# Patient Record
Sex: Male | Born: 1990 | Race: Black or African American | Hispanic: No | Marital: Single | Smoking: Current some day smoker
Health system: Southern US, Community
[De-identification: ages and names within clinical notes are randomized; demographics above are authoritative.]

---
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
## 2014-07-22 ENCOUNTER — Emergency Department: Payer: Self-pay | Admitting: Emergency Medicine

## 2015-12-23 ENCOUNTER — Emergency Department
Admission: EM | Admit: 2015-12-23 | Discharge: 2015-12-23 | Disposition: A | Discharge status: Home / Self Care | Payer: Self-pay | Attending: Emergency Medicine | Admitting: Emergency Medicine

## 2015-12-23 ENCOUNTER — Encounter: Payer: Self-pay | Admitting: Emergency Medicine

## 2015-12-23 ENCOUNTER — Emergency Department: Payer: Self-pay

## 2015-12-23 DIAGNOSIS — F17210 Nicotine dependence, cigarettes, uncomplicated: Secondary | ICD-10-CM | POA: Insufficient documentation

## 2015-12-23 DIAGNOSIS — M542 Cervicalgia: Secondary | ICD-10-CM | POA: Insufficient documentation

## 2015-12-23 DIAGNOSIS — Y929 Unspecified place or not applicable: Secondary | ICD-10-CM | POA: Insufficient documentation

## 2015-12-23 DIAGNOSIS — Y999 Unspecified external cause status: Secondary | ICD-10-CM | POA: Insufficient documentation

## 2015-12-23 DIAGNOSIS — W108XXA Fall (on) (from) other stairs and steps, initial encounter: Secondary | ICD-10-CM | POA: Insufficient documentation

## 2015-12-23 DIAGNOSIS — M549 Dorsalgia, unspecified: Secondary | ICD-10-CM | POA: Insufficient documentation

## 2015-12-23 DIAGNOSIS — Y939 Activity, unspecified: Secondary | ICD-10-CM | POA: Insufficient documentation

## 2015-12-23 DIAGNOSIS — S300XXA Contusion of lower back and pelvis, initial encounter: Secondary | ICD-10-CM | POA: Insufficient documentation

## 2015-12-23 MED ORDER — OXYCODONE-ACETAMINOPHEN 7.5-325 MG PO TABS
1.0000 | ORAL_TABLET | Freq: Four times a day (QID) | ORAL | 0 refills | Status: DC | PRN
Start: 2015-12-23 — End: 2023-02-14

## 2015-12-23 MED ORDER — OXYCODONE-ACETAMINOPHEN 5-325 MG PO TABS
1.0000 | ORAL_TABLET | Freq: Once | ORAL | Status: AC
  Administered 2015-12-23: 1 via ORAL
  Filled 2015-12-23: qty 1

## 2015-12-23 MED ORDER — NAPROXEN 500 MG PO TABS: 500.0000 mg | ORAL_TABLET | Freq: Two times a day (BID) | ORAL | 0 refills | Status: DC

## 2015-12-23 MED ORDER — NAPROXEN 500 MG PO TABS
500.0000 mg | ORAL_TABLET | Freq: Once | ORAL | Status: AC
  Administered 2015-12-23: 500 mg via ORAL
  Filled 2015-12-23: qty 1

## 2015-12-23 NOTE — ED Notes (Signed)
Patient transported to X-ray 

## 2015-12-23 NOTE — ED Notes (Signed)
Pt reports slipped down the steps this am hurting his back. Pt reports was holding younger brother at the time and took the brunt of the impact to keep him safe. Pt c/o pain to very lower back. Pt also c/o neck stiffness from the fall. Was told by first responders that he may have whiplash. Pt denies LOC.  No obvious bruising noted.

## 2015-12-23 NOTE — ED Triage Notes (Signed)
Pt states he fell down the stairs this morning while carrying his younger brother. States that his lower back, tailbone, and neck hurt. Pt also reports that the step broke when he fell. Pt is moving around and groaning during triage.

## 2015-12-23 NOTE — ED Provider Notes (Signed)
[facility] Emergency Department Provider Note   ____________________________________________   None    (approximate)  I have reviewed the triage vital signs and the nursing notes.   HISTORY  Chief Complaint Fall and Back Pain    HPI Glenn Robbins is a 25 y.o. male patient complaining of coccyx pain secondary to a fall. Patient say fell while going down steps care and a younger brother. Patient also complaining of low back and neck pain. He denies any radicular component to her neck or back pain. He denies any bladder or bowel dysfunction. Instead occurred this morning. Patient states pain increase with sitting.Patient rates his pain as a 9/10. Patient described a pain as sharp. No palliative measures for this complaint.   History reviewed. No pertinent past medical history.  There are no active problems to display for this patient.   History reviewed. No pertinent surgical history.  Prior to Admission medications   Medication Sig Start Date End Date Taking? Authorizing Provider  naproxen (NAPROSYN) 500 MG tablet Take 1 tablet (500 mg total) by mouth 2 (two) times daily with a meal. 12/23/15   Joni Reining, PA-C  oxyCODONE-acetaminophen (PERCOCET) 7.5-325 MG tablet Take 1 tablet by mouth every 6 (six) hours as needed for severe pain. 12/23/15   Joni Reining, PA-C    Allergies Review of patient's allergies indicates not on file.  History reviewed. No pertinent family history.  Social History Social History  Substance Use Topics  . Smoking status: Current Every Day Smoker    Packs/day: 0.50    Types: Cigarettes  . Smokeless tobacco: Never Used  . Alcohol use No    Review of Systems Constitutional: No fever/chills Eyes: No visual changes. ENT: No sore throat. Cardiovascular: Denies chest pain. Respiratory: Denies shortness of breath. Gastrointestinal: No abdominal pain.  No nausea, no vomiting.  No diarrhea.  No  constipation. Genitourinary: Negative for dysuria. Musculoskeletal: Positive for back pain. Skin: Negative for rash. Neurological: Negative for headaches, focal weakness or numbness.    ____________________________________________   PHYSICAL EXAM:  VITAL SIGNS: ED Triage Vitals [12/23/15 0944]  Enc Vitals Group     BP 136/82     Pulse Rate 74     Resp 20     Temp 98.2 F (36.8 C)     Temp src      SpO2 98 %     Weight (!) 340 lb (154.2 kg)     Height 5\' 11"  (1.803 m)     Head Circumference      Peak Flow      Pain Score 9     Pain Loc      Pain Edu?      Excl. in GC?     Constitutional: Alert and oriented. Well appearing and in no acute distress.Obesity Eyes: Conjunctivae are normal. PERRL. EOMI. Head: Atraumatic. Nose: No congestion/rhinnorhea. Mouth/Throat: Mucous membranes are moist.  Oropharynx non-erythematous. Neck: No stridor.  No cervical spine tenderness to palpation. Hematological/Lymphatic/Immunilogical: No cervical lymphadenopathy. Cardiovascular: Normal rate, regular rhythm. Grossly normal heart sounds.  Good peripheral circulation. Respiratory: Normal respiratory effort.  No retractions. Lungs CTAB. Gastrointestinal: Soft and nontender. No distention. No abdominal bruits. No CVA tenderness. Musculoskeletal: No lower extremity tenderness nor edema.  No joint effusions. Moderate guarding palpation L4-S1. Neurologic:  Normal speech and language. No gross focal neurologic deficits are appreciated. No gait instability. Skin:  Skin is warm, dry and intact. No rash noted. No abrasion or ecchymosis. Psychiatric:  Mood and affect are normal. Speech and behavior are normal.  ____________________________________________   LABS (all labs ordered are listed, but only abnormal results are displayed)  Labs Reviewed - No data to display ____________________________________________  EKG   ____________________________________________  RADIOLOGY  No acute  findings x-ray of the coccyx area. ____________________________________________   PROCEDURES  Procedure(s) performed: None  Procedures  Critical Care performed: No  ____________________________________________   INITIAL IMPRESSION / ASSESSMENT AND PLAN / ED COURSE  Pertinent labs & imaging results that were available during my care of the patient were reviewed by me and considered in my medical decision making (see chart for details).  Coccyx contusion. Discussed negative x-ray findings with patient. Patient given discharge Instructions. Patient given prescription for naproxen and Percocets. Patient [facility] condition [facility].  Clinical Course     ____________________________________________   FINAL CLINICAL IMPRESSION(S) / ED DIAGNOSES  Final diagnoses:  Coccyx contusion, initial encounter      NEW MEDICATIONS STARTED DURING THIS VISIT:  New Prescriptions   NAPROXEN (NAPROSYN) 500 MG TABLET    Take 1 tablet (500 mg total) by mouth 2 (two) times daily with a meal.   OXYCODONE-ACETAMINOPHEN (PERCOCET) 7.5-325 MG TABLET    Take 1 tablet by mouth every 6 (six) hours as needed for severe pain.     Note:  This document was prepared using Dragon voice recognition software and may include unintentional dictation errors.    Joni Reiningonald K Smith, PA-C 12/23/15 1139    Jeanmarie PlantJames A McShane, MD 12/23/15 1501

## 2016-05-01 ENCOUNTER — Encounter: Payer: Self-pay | Admitting: Emergency Medicine

## 2016-05-01 ENCOUNTER — Emergency Department
Admission: EM | Admit: 2016-05-01 | Discharge: 2016-05-01 | Disposition: A | Discharge status: Home / Self Care | Payer: Self-pay | Attending: Emergency Medicine | Admitting: Emergency Medicine

## 2016-05-01 DIAGNOSIS — Y999 Unspecified external cause status: Secondary | ICD-10-CM | POA: Insufficient documentation

## 2016-05-01 DIAGNOSIS — Y9389 Activity, other specified: Secondary | ICD-10-CM | POA: Insufficient documentation

## 2016-05-01 DIAGNOSIS — S61216D Laceration without foreign body of right little finger without damage to nail, subsequent encounter: Secondary | ICD-10-CM

## 2016-05-01 DIAGNOSIS — Y929 Unspecified place or not applicable: Secondary | ICD-10-CM | POA: Insufficient documentation

## 2016-05-01 DIAGNOSIS — S61216A Laceration without foreign body of right little finger without damage to nail, initial encounter: Secondary | ICD-10-CM | POA: Insufficient documentation

## 2016-05-01 DIAGNOSIS — W25XXXA Contact with sharp glass, initial encounter: Secondary | ICD-10-CM | POA: Insufficient documentation

## 2016-05-01 DIAGNOSIS — F17210 Nicotine dependence, cigarettes, uncomplicated: Secondary | ICD-10-CM | POA: Insufficient documentation

## 2016-05-01 MED ORDER — TRAMADOL HCL 50 MG PO TABS
50.0000 mg | ORAL_TABLET | Freq: Once | ORAL | Status: AC
  Administered 2016-05-01: 50 mg via ORAL
  Filled 2016-05-01: qty 1

## 2016-05-01 MED ORDER — SULFAMETHOXAZOLE-TRIMETHOPRIM 800-160 MG PO TABS
1.0000 | ORAL_TABLET | Freq: Two times a day (BID) | ORAL | 0 refills | Status: DC
Start: 2016-05-01 — End: 2023-02-14

## 2016-05-01 MED ORDER — SULFAMETHOXAZOLE-TRIMETHOPRIM 800-160 MG PO TABS
1.0000 | ORAL_TABLET | Freq: Once | ORAL | Status: AC
  Administered 2016-05-01: 1 via ORAL
  Filled 2016-05-01: qty 1

## 2016-05-01 MED ORDER — TRAMADOL HCL 50 MG PO TABS: 50.0000 mg | ORAL_TABLET | Freq: Once | ORAL | Status: DC

## 2016-05-01 MED ORDER — TRAMADOL HCL 50 MG PO TABS: 50.0000 mg | ORAL_TABLET | Freq: Four times a day (QID) | ORAL | 0 refills | Status: DC | PRN

## 2016-05-01 NOTE — ED Provider Notes (Signed)
[facility] Emergency Department Provider Note   ____________________________________________   First MD Initiated Contact with Patient 05/01/16 1309     (approximate)  I have reviewed the triage vital signs and the nursing notes.   HISTORY  Chief Complaint Laceration    HPI Glenn Robbins is a 26 y.o. male patient has a laceration dose aspect of the right fifth digit. He stated glass cut to the fifth finger. Incident occurred greater than 24 hours ago in nature glass cut. Patient state bleeding is controlled with direct pressure. Patient denies loss sensation or loss of function of the finger. No other palliative measures for this complaint.Patient rates his pain as 8/10. Patient described a pain as "achy".   History reviewed. No pertinent past medical history.  There are no active problems to display for this patient.   History reviewed. No pertinent surgical history.  Prior to Admission medications   Medication Sig Start Date End Date Taking? Authorizing Provider  naproxen (NAPROSYN) 500 MG tablet Take 1 tablet (500 mg total) by mouth 2 (two) times daily with a meal. 12/23/15   Joni Reining, PA-C  oxyCODONE-acetaminophen (PERCOCET) 7.5-325 MG tablet Take 1 tablet by mouth every 6 (six) hours as needed for severe pain. 12/23/15   Joni Reining, PA-C    Allergies Patient has no known allergies.  History reviewed. No pertinent family history.  Social History Social History  Substance Use Topics  . Smoking status: Current Every Day Smoker    Packs/day: 0.50    Types: Cigarettes  . Smokeless tobacco: Never Used  . Alcohol use No    Review of Systems Constitutional: No fever/chills Eyes: No visual changes. ENT: No sore throat. Cardiovascular: Denies chest pain. Respiratory: Denies shortness of breath. Gastrointestinal: No abdominal pain.  No nausea, no vomiting.  No diarrhea.  No constipation. Genitourinary: Negative for  dysuria. Musculoskeletal: Negative for back pain. Skin: Negative for rash.  Right fifth finger laceration Neurological: Negative for headaches, focal weakness or numbness.   ____________________________________________   PHYSICAL EXAM:  VITAL SIGNS: ED Triage Vitals  Enc Vitals Group     BP 05/01/16 1223 (!) 156/75     Pulse Rate 05/01/16 1223 84     Resp 05/01/16 1223 20     Temp 05/01/16 1223 98.3 F (36.8 C)     Temp Source 05/01/16 1223 Oral     SpO2 05/01/16 1223 97 %     Weight 05/01/16 1222 (!) 340 lb (154.2 kg)     Height 05/01/16 1222 5\' 11"  (1.803 m)     Head Circumference --      Peak Flow --      Pain Score 05/01/16 1222 8     Pain Loc --      Pain Edu? --      Excl. in GC? --     Constitutional: Alert and oriented. Well appearing and in no acute distress. Eyes: Conjunctivae are normal. PERRL. EOMI. Head: Atraumatic. Nose: No congestion/rhinnorhea. Mouth/Throat: Mucous membranes are moist.  Oropharynx non-erythematous. Neck: No stridor. No cervical spine tenderness to palpation. Hematological/Lymphatic/Immunilogical: No cervical lymphadenopathy. Cardiovascular: Normal rate, regular rhythm. Grossly normal heart sounds.  Good peripheral circulation. Respiratory: Normal respiratory effort.  No retractions. Lungs CTAB. Gastrointestinal: Soft and nontender. No distention. No abdominal bruits. No CVA tenderness. Musculoskeletal: No lower extremity tenderness nor edema.  No joint effusions. Neurologic:  Normal speech and language. No gross focal neurologic deficits are appreciated. No gait instability. Skin:  Skin is warm, dry and intact. No rash noted. 1.5 cm laceration dorsal aspect of the finger at the proximal phalange. Psychiatric: Mood and affect are normal. Speech and behavior are normal.  ____________________________________________   LABS (all labs ordered are listed, but only abnormal results are displayed)  Labs Reviewed - No data to  display ____________________________________________  EKG   ____________________________________________  RADIOLOGY   ____________________________________________   PROCEDURES  Procedure(s) performed: None  Procedures  Critical Care performed: No  ____________________________________________   INITIAL IMPRESSION / ASSESSMENT AND PLAN / ED COURSE  Pertinent labs & imaging results that were available during my care of the patient were reviewed by me and considered in my medical decision making (see chart for details).  Laceration fifth digit right hand. Patient given discharge care instructions. Patient given a prescription for Bactrim DS and 3 days of tramadol. Patient advised [facility].  Clinical Course   Patient herself laceration reviewed and 24 hours to the digit right hand. Discussed patient rationale for not suturing at this time. Area was thoroughly irrigated and cleaned. Steri-Strips were applied.   ____________________________________________   FINAL CLINICAL IMPRESSION(S) / ED DIAGNOSES  Final diagnoses:  Laceration of right little finger without foreign body, nail damage status unspecified, subsequent encounter      NEW MEDICATIONS STARTED DURING THIS VISIT:  New Prescriptions   No medications on file     Note:  This document was prepared using Dragon voice recognition software and may include unintentional dictation errors.    Joni Reiningonald K Kayal Mula, PA-C 05/01/16 1327    Phineas SemenGraydon Goodman, MD 05/01/16 [phone]

## 2016-05-01 NOTE — ED Triage Notes (Signed)
Pt reports was moving glass and a piece fell and cut 5th digit right hand. Happened yesterday at 1100 am.

## 2017-07-24 IMAGING — CR DG SACRUM/COCCYX 2+V
1 series · 3 of 3 positions shown · non-contrast
Comparison: None.

CLINICAL DATA: 24-year-old male status post fall down stairs on to
tail bone today. Initial encounter.

EXAM:
SACRUM AND COCCYX - 2+ VIEW

[Series 1: dg sacrum/coccyx · 0.14mm/px · 3 of 3 slices shown]
[im 1/3]
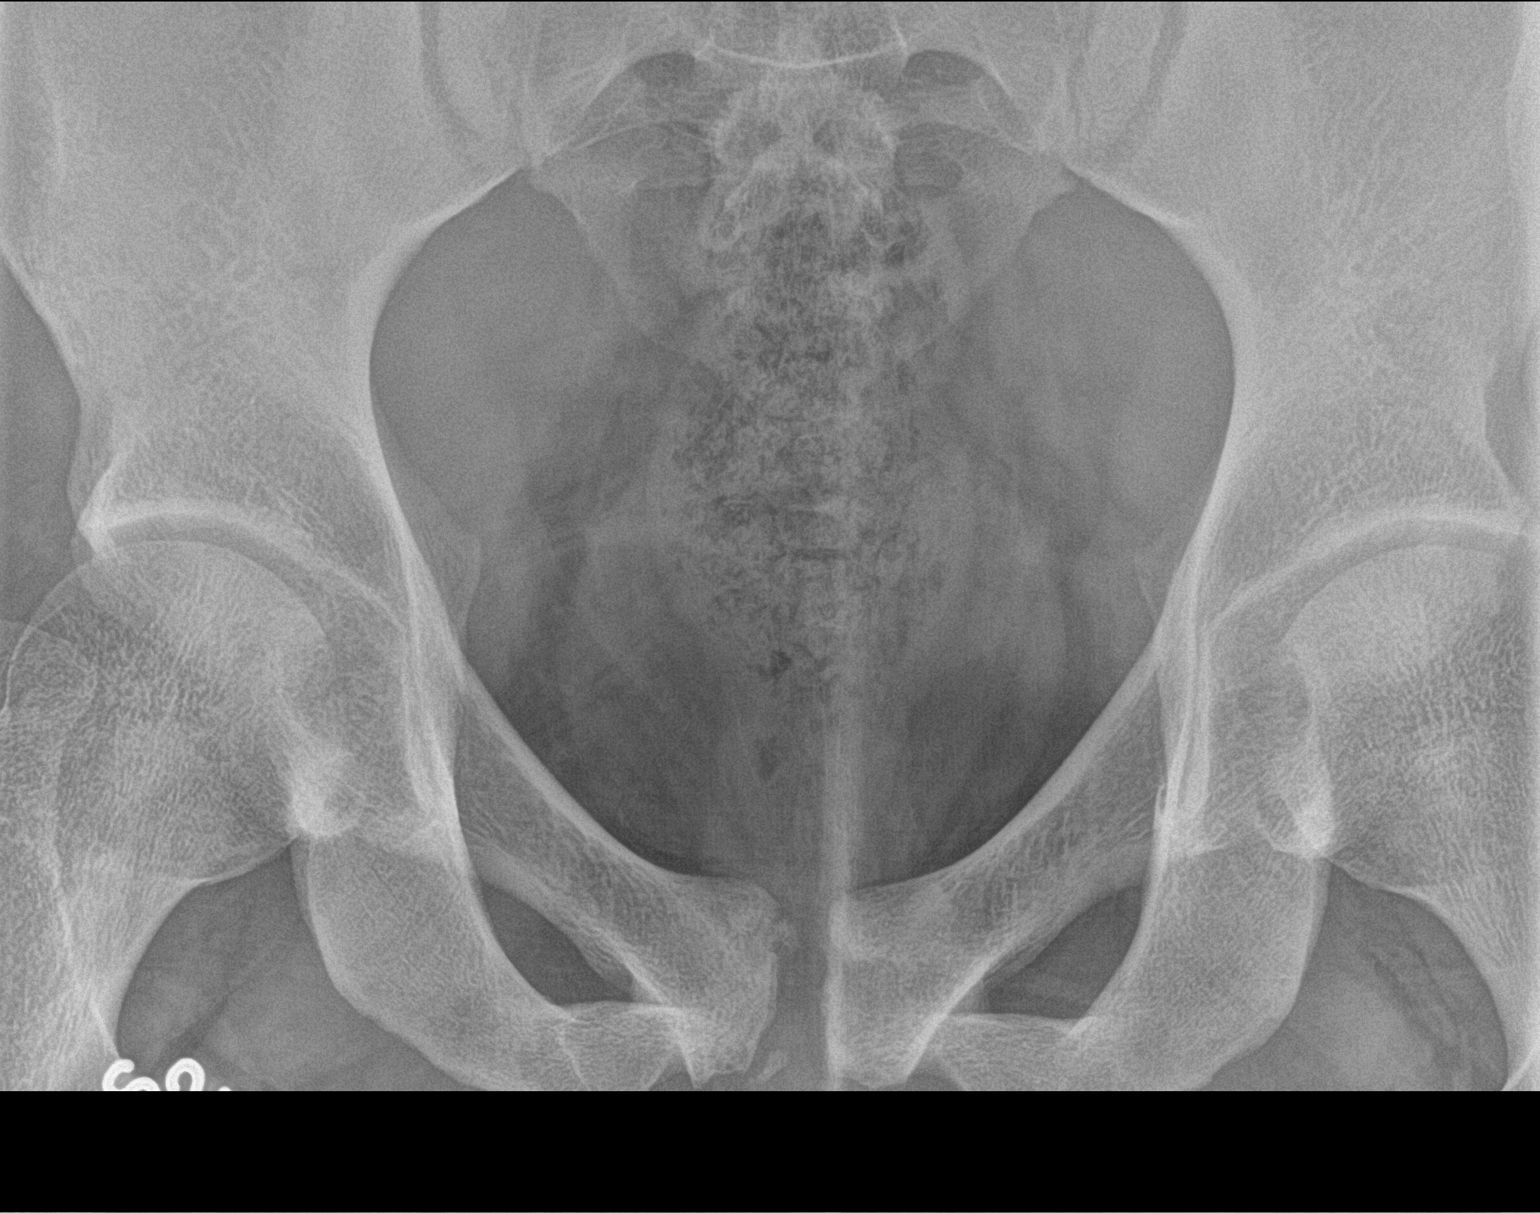
[im 2/3]
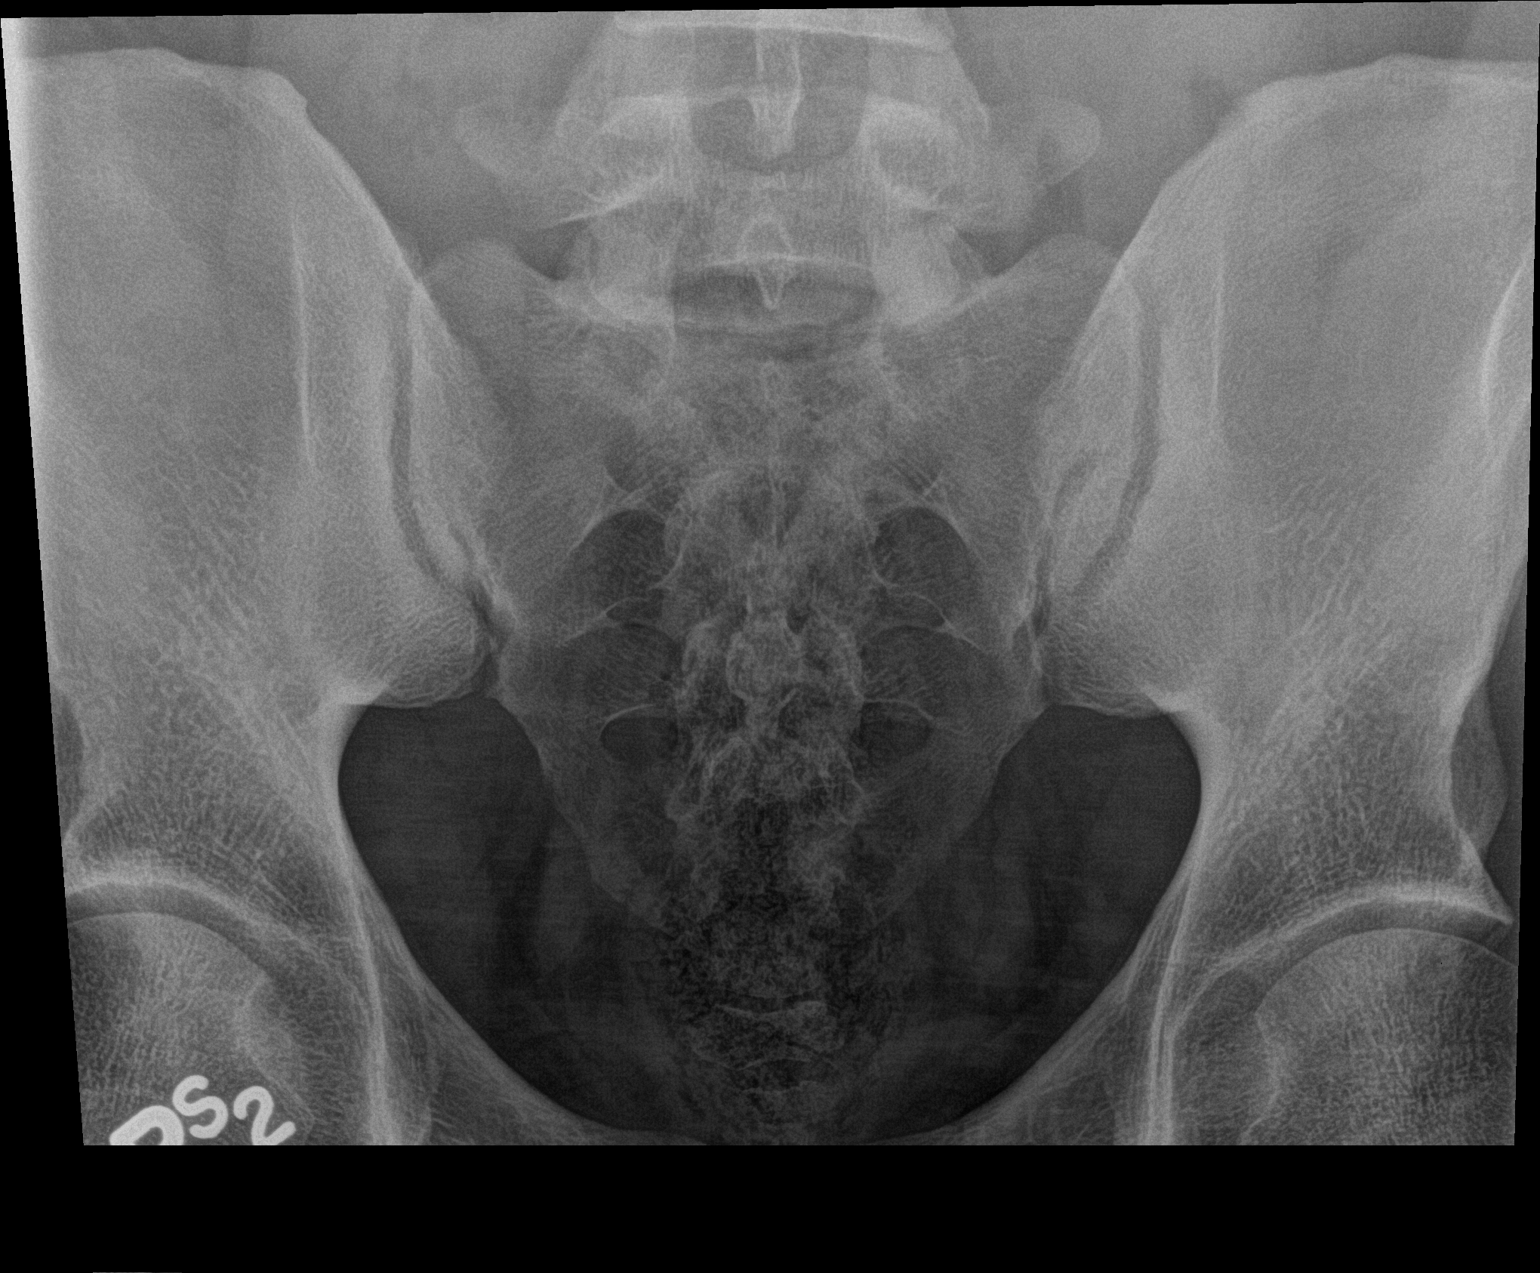
[im 3/3]
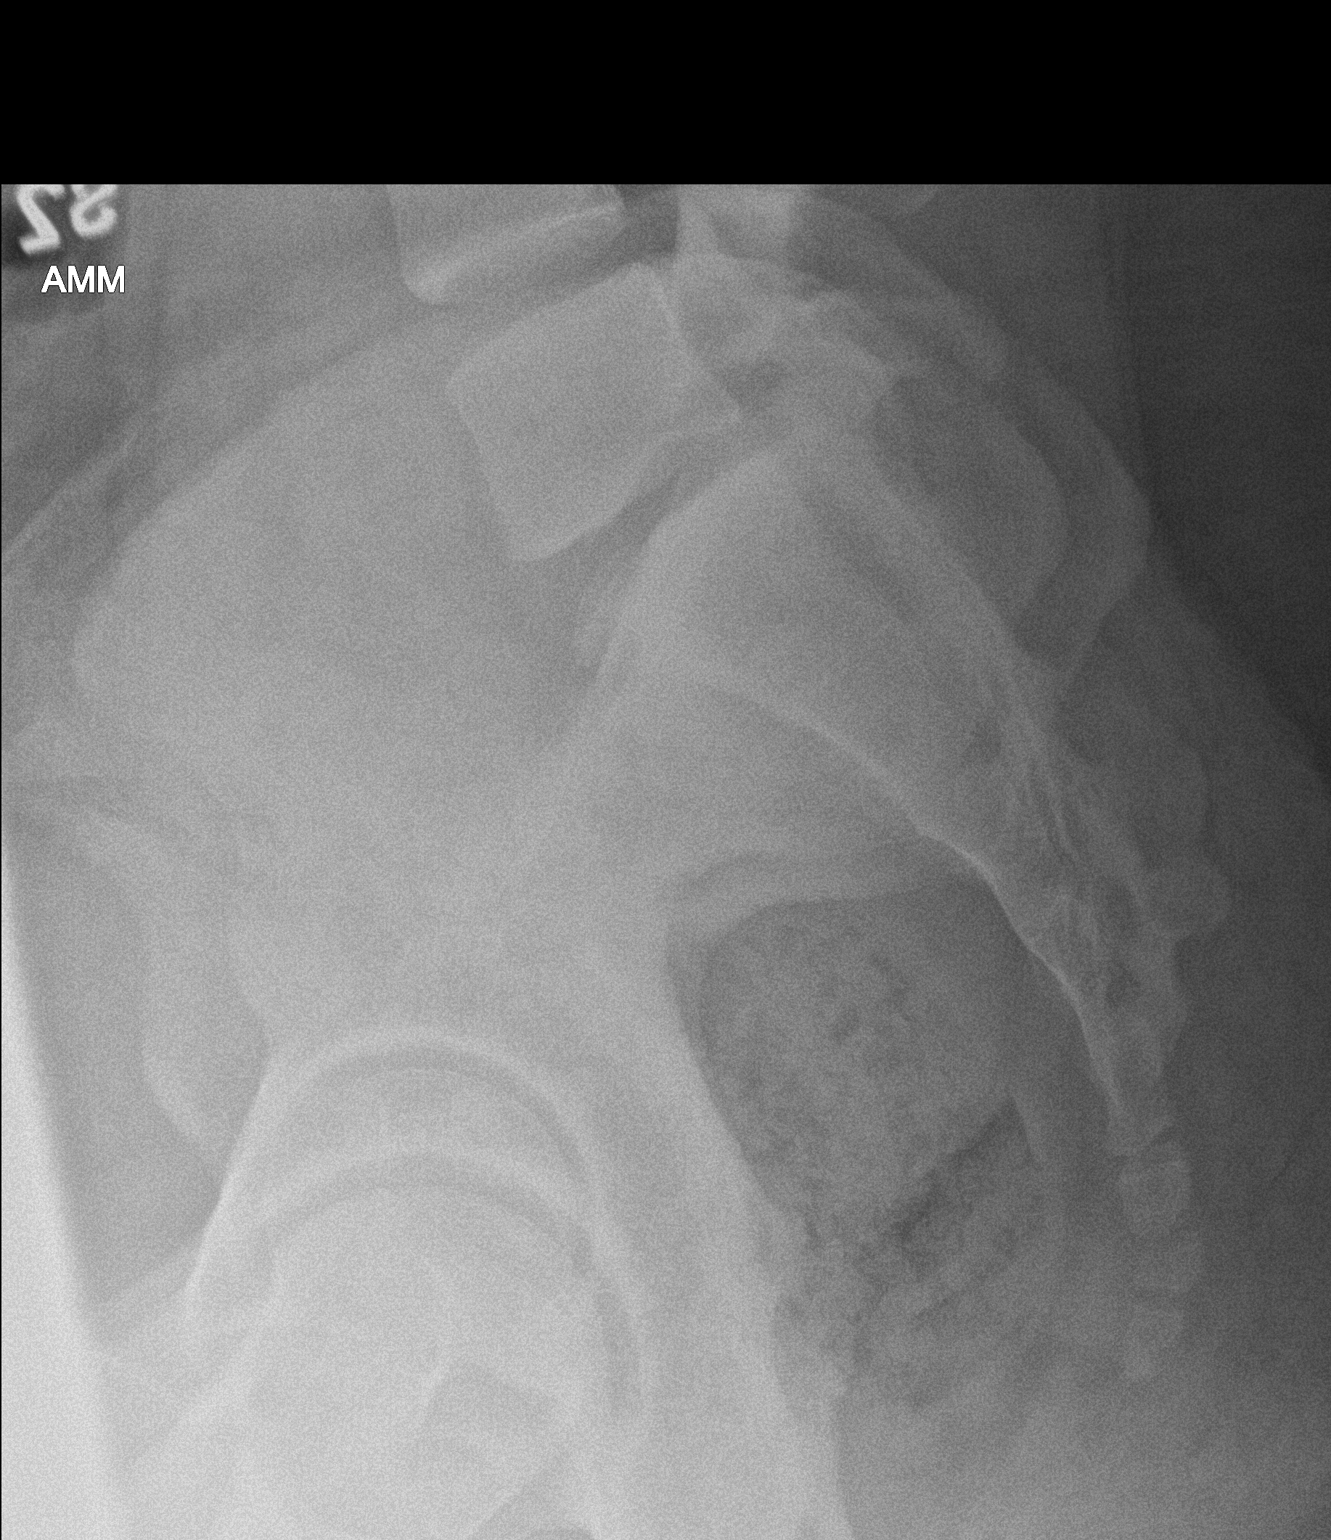

[3 of 3 positions shown; findings below may reference images not displayed]

FINDINGS: Bone mineralization is within normal limits. Sacral ala and SI
joints appear normal. On the lateral view the sacral and coccygeal
segments appear normally aligned and intact. Grossly intact visible
lower lumbar levels. Mild osseous irregularity about the symphysis
pubis appears chronic and might reflect normal variation. Visible
pelvis otherwise appears normal.
IMPRESSION: No acute fracture or dislocation identified about the sacrum or
coccyx.

## 2023-01-13 ENCOUNTER — Encounter (HOSPITAL_COMMUNITY): Payer: Self-pay

## 2023-01-13 ENCOUNTER — Emergency Department (HOSPITAL_COMMUNITY)
Admission: EM | Admit: 2023-01-13 | Discharge: 2023-01-14 | Disposition: A | Discharge status: Home / Self Care | Payer: 59 | Attending: Emergency Medicine | Admitting: Emergency Medicine

## 2023-01-13 ENCOUNTER — Other Ambulatory Visit: Payer: Self-pay

## 2023-01-13 DIAGNOSIS — L304 Erythema intertrigo: Secondary | ICD-10-CM | POA: Insufficient documentation

## 2023-01-13 MED ORDER — SULFAMETHOXAZOLE-TRIMETHOPRIM 800-160 MG PO TABS: 1.0000 | ORAL_TABLET | Freq: Two times a day (BID) | ORAL | 0 refills | Status: AC

## 2023-01-13 MED ORDER — SULFAMETHOXAZOLE-TRIMETHOPRIM 800-160 MG PO TABS
1.0000 | ORAL_TABLET | Freq: Once | ORAL | Status: AC
  Administered 2023-01-13: 1 via ORAL
  Filled 2023-01-13: qty 1

## 2023-01-13 NOTE — ED Triage Notes (Signed)
PT c/o abscess on right inner thighx2d. Pt has an abscess approx the size of a quarter and it has a approx 3cm opening at top of it on right inner thigh.

## 2023-01-13 NOTE — ED Provider Notes (Signed)
[facility] Provider Note   CSN: [id number] Arrival date & time: 01/13/23  1819     History  Chief Complaint  Patient presents with   Abscess    Glenn Robbins is a 32 y.o. male.  The history is provided by the patient.  Abscess Abscess location: right inner thigh, proximal. Abscess quality: not draining and no induration   Red streaking: no   Duration:  3 days Chronicity:  New Context: not diabetes   Relieved by:  Nothing Worsened by:  Nothing Ineffective treatments:  None tried Associated symptoms: no anorexia, no fever and no vomiting   Risk factors: no family hx of MRSA      History reviewed. No pertinent past medical history.   Home Medications Prior to Admission medications   Medication Sig Start Date End Date Taking? Authorizing Provider  sulfamethoxazole-trimethoprim (BACTRIM DS) 800-160 MG tablet Take 1 tablet by mouth 2 (two) times daily for 7 days. 01/13/23 01/20/23 Yes Glenn Strothman, MD  naproxen (NAPROSYN) 500 MG tablet Take 1 tablet (500 mg total) by mouth 2 (two) times daily with a meal. 12/23/15   Glenn Reining, PA-C  oxyCODONE-acetaminophen (PERCOCET) 7.5-325 MG tablet Take 1 tablet by mouth every 6 (six) hours as needed for severe pain. 12/23/15   Glenn Reining, PA-C  sulfamethoxazole-trimethoprim (BACTRIM DS,SEPTRA DS) 800-160 MG tablet Take 1 tablet by mouth 2 (two) times daily. 05/01/16   Glenn Reining, PA-C  traMADol (ULTRAM) 50 MG tablet Take 1 tablet (50 mg total) by mouth every 6 (six) hours as needed for moderate pain. 05/01/16   Glenn Reining, PA-C      Allergies    Patient has no known allergies.    Review of Systems   Review of Systems  Constitutional:  Negative for fever.  HENT:  Negative for facial swelling.   Gastrointestinal:  Negative for anorexia and vomiting.  Neurological:  Negative for facial asymmetry.  All other systems reviewed and are negative.   Physical Exam Updated  Vital Signs BP (!) 164/95 (BP Location: Left Arm)   Pulse 91   Temp 98 F (36.7 C)   Resp 16   Ht 5\' 11"  (1.803 m)   Wt (!) 154.2 kg   SpO2 100%   BMI 47.41 kg/m  Physical Exam Vitals and nursing note reviewed. Exam conducted with a chaperone present.  Constitutional:      General: He is not in acute distress.    Appearance: He is well-developed. He is not diaphoretic.  HENT:     Head: Normocephalic and atraumatic.     Nose: Nose normal.  Eyes:     Conjunctiva/sclera: Conjunctivae normal.     Pupils: Pupils are equal, round, and reactive to light.  Cardiovascular:     Rate and Rhythm: Normal rate and regular rhythm.  Pulmonary:     Effort: Pulmonary effort is normal.     Breath sounds: Normal breath sounds. No wheezing or rales.  Abdominal:     General: Bowel sounds are normal.     Palpations: Abdomen is soft.     Tenderness: There is no abdominal tenderness. There is no guarding or rebound.  Musculoskeletal:        General: Normal range of motion.     Cervical back: Normal range of motion and neck supple.       Legs:  Skin:    General: Skin is warm and dry.     Capillary  Refill: Capillary refill takes less than 2 seconds.  Neurological:     General: No focal deficit present.     Mental Status: He is alert and oriented to person, place, and time.     Deep Tendon Reflexes: Reflexes normal.  Psychiatric:        Mood and Affect: Mood normal.     ED Results / Procedures / Treatments   Labs (all labs ordered are listed, but only abnormal results are displayed) Labs Reviewed - No data to display  EKG None  Radiology No results found.  Procedures Procedures    Medications Ordered in ED Medications  sulfamethoxazole-trimethoprim (BACTRIM DS) 800-160 MG per tablet 1 tablet (1 tablet Oral Given 01/13/23 2342)    ED Course/ Medical Decision Making/ A&P                                 Medical Decision Making Patient with concerns for abscess   Amount  and/or Complexity of Data Reviewed External Data Reviewed: notes.    Details: Previous notes reviewed   Risk Prescription drug management. Risk Details: The area is not an abscess it is a chafed area. Will start antibiotics.  Stable for discharge     Final Clinical Impression(s) / ED Diagnoses Final diagnoses:  Chafing   Return for intractable cough, coughing up blood, fevers > 100.4 unrelieved by medication, shortness of breath, intractable vomiting, chest pain, shortness of breath, weakness, numbness, changes in speech, facial asymmetry, abdominal pain, passing out, Inability to tolerate liquids or food, cough, altered mental status or any concerns. No signs of systemic illness or infection. The patient is nontoxic-appearing on exam and vital signs are within normal limits.  I have reviewed the triage vital signs and the nursing notes. Pertinent labs & imaging results that were available during my care of the patient were reviewed by me and considered in my medical decision making (see chart for details). After history, exam, and medical workup I feel the patient has been appropriately medically screened and is safe for discharge home. Pertinent diagnoses were discussed with the patient. Patient was given return precautions.    Rx / DC Orders ED Discharge Orders          Ordered    sulfamethoxazole-trimethoprim (BACTRIM DS) 800-160 MG tablet  2 times daily        01/13/23 2315              Opie Fanton, MD 01/14/23 0000

## 2023-02-14 ENCOUNTER — Ambulatory Visit (HOSPITAL_COMMUNITY)
Admission: EM | Admit: 2023-02-14 | Discharge: 2023-02-14 | Disposition: A | Discharge status: Home / Self Care | Payer: 59 | Attending: Internal Medicine | Admitting: Internal Medicine

## 2023-02-14 ENCOUNTER — Encounter (HOSPITAL_COMMUNITY): Payer: Self-pay

## 2023-02-14 DIAGNOSIS — R600 Localized edema: Secondary | ICD-10-CM | POA: Insufficient documentation

## 2023-02-14 LAB — COMPREHENSIVE METABOLIC PANEL
ALT: 33 U/L (ref 0–44)
AST: 31 U/L (ref 15–41)
Albumin: 3.7 g/dL (ref 3.5–5.0)
Alkaline Phosphatase: 98 U/L (ref 38–126)
Anion gap: 13 (ref 5–15)
BUN: 9 mg/dL (ref 6–20)
CO2: 25 mmol/L (ref 22–32)
Calcium: 9.2 mg/dL (ref 8.9–10.3)
Chloride: 99 mmol/L (ref 98–111)
Creatinine, Ser: 0.98 mg/dL (ref 0.61–1.24)
GFR, Estimated: >60 mL/min (ref >60)
Glucose, Bld: 258 mg/dL — ABNORMAL HIGH (ref 70–99)
Potassium: 4.5 mmol/L (ref 3.5–5.1)
Sodium: 137 mmol/L (ref 135–145)
Total Bilirubin: 0.9 mg/dL (ref 0.3–1.2)
Total Protein: 6.9 g/dL (ref 6.5–8.1)

## 2023-02-14 LAB — CBC WITH DIFFERENTIAL/PLATELET
Abs Immature Granulocytes: 0.02 10*3/uL (ref 0.00–0.07)
Basophils Absolute: 0.1 10*3/uL (ref 0.0–0.1)
Basophils Relative: 1 %
Eosinophils Absolute: 0.2 10*3/uL (ref 0.0–0.5)
Eosinophils Relative: 3 %
HCT: 46.1 % (ref 39.0–52.0)
Hemoglobin: 15.5 g/dL (ref 13.0–17.0)
Immature Granulocytes: 0 %
Lymphocytes Relative: 27 %
Lymphs Abs: 1.9 10*3/uL (ref 0.7–4.0)
MCH: 29.1 pg (ref 26.0–34.0)
MCHC: 33.6 g/dL (ref 30.0–36.0)
MCV: 86.7 fL (ref 80.0–100.0)
Monocytes Absolute: 0.7 10*3/uL (ref 0.1–1.0)
Monocytes Relative: 11 %
Neutro Abs: 4 10*3/uL (ref 1.7–7.7)
Neutrophils Relative %: 58 %
Platelets: 195 10*3/uL (ref 150–400)
RBC: 5.32 MIL/uL (ref 4.22–5.81)
RDW: 13 % (ref 11.5–15.5)
WBC: 6.9 10*3/uL (ref 4.0–10.5)
nRBC: 0 % (ref 0.0–0.2)

## 2023-02-14 LAB — POCT FASTING CBG KUC MANUAL ENTRY: POCT Glucose (KUC): 274 mg/dL — AB (ref 70–99)

## 2023-02-14 MED ORDER — CEPHALEXIN 500 MG PO CAPS: 500.0000 mg | ORAL_CAPSULE | Freq: Three times a day (TID) | ORAL | 0 refills | Status: AC

## 2023-02-14 NOTE — ED Notes (Signed)
DOPPLER STUDY TOMORROW (02/15/2023- TUESDAY)  AT 11:00 A.M.  REPORT TO ENTRANCE C AT [facility]

## 2023-02-14 NOTE — ED Provider Notes (Signed)
[facility]    CSN: [id number] Arrival date & time: 02/14/23  1331      History   Chief Complaint Chief Complaint  Patient presents with   Leg Swelling    HPI Glenn Robbins is a 32 y.o. male morbidly obese homeless man comes to the urgent care with a 2 to 3-day history of bilateral lower extremity swelling.  Patient says symptoms started about 3 days ago and has been worsening.  He has noticed some redness on the lower extremities.  He is currently homeless and lives out of his vehicle.  He denies any chest pain or chest pressure.  No shortness of breath on exertion.  Patient is able to bear weight on both lower extremities.  He endorses increased thirst and increased urination.  No dysuria urgency or frequency.  Patient denies any orthopnea or paroxysmal nocturnal dyspnea.  No history of snoring.   HPI  History reviewed. No pertinent past medical history.  There are no problems to display for this patient.   History reviewed. No pertinent surgical history.     Home Medications    Prior to Admission medications   Medication Sig Start Date End Date Taking? Authorizing Provider  cephALEXin (KEFLEX) 500 MG capsule Take 1 capsule (500 mg total) by mouth 3 (three) times daily for 7 days. 02/14/23 02/21/23 Yes Erisha Paugh, Britta Mccreedy, MD    Family History History reviewed. No pertinent family history.  Social History Social History   Tobacco Use   Smoking status: Some Days    Types: Cigars   Smokeless tobacco: Never  Substance Use Topics   Alcohol use: No   Drug use: No     Allergies   Patient has no known allergies.   Review of Systems Review of Systems As per HPI  Physical Exam Triage Vital Signs ED Triage Vitals [02/14/23 1512]  Encounter Vitals Group     BP (!) 144/96     Systolic BP Percentile      Diastolic BP Percentile      Pulse Rate 89     Resp 20     Temp 98.2 F (36.8 C)     Temp Source Oral     SpO2 98 %     Weight      Height       Head Circumference      Peak Flow      Pain Score 10     Pain Loc      Pain Education      Exclude from Growth Chart    No data found.  Updated Vital Signs BP (!) 144/96 (BP Location: Left Arm)   Pulse 89   Temp 98.2 F (36.8 C) (Oral)   Resp 20   SpO2 98%   Visual Acuity Right Eye Distance:   Left Eye Distance:   Bilateral Distance:    Right Eye Near:   Left Eye Near:    Bilateral Near:     Physical Exam Constitutional:      General: He is not in acute distress.    Appearance: Normal appearance. He is obese. He is not diaphoretic.  Cardiovascular:     Rate and Rhythm: Normal rate and regular rhythm.     Pulses: Normal pulses.     Heart sounds: Normal heart sounds.  Pulmonary:     Effort: Pulmonary effort is normal. No respiratory distress.     Breath sounds: Normal breath sounds. No stridor. No wheezing or rhonchi.  Abdominal:     General: Bowel sounds are normal.     Palpations: Abdomen is soft.  Musculoskeletal:        General: Normal range of motion.     Comments: Bilateral lower extremity swelling with erythema of both lower extremities.  Patient's calves are tender and tight  Neurological:     Mental Status: He is alert.      UC Treatments / Results  Labs (all labs ordered are listed, but only abnormal results are displayed) Labs Reviewed  CBC WITH DIFFERENTIAL/PLATELET  COMPREHENSIVE METABOLIC PANEL  POCT FASTING CBG [facility] MANUAL ENTRY    EKG   Radiology No results found.  Procedures Procedures (including critical care time)  Medications Ordered in UC Medications - No data to display  Initial Impression / Assessment and Plan / UC Course  I have reviewed the triage vital signs and the nursing notes.  Pertinent labs & imaging results that were available during my care of the patient were reviewed by me and considered in my medical decision making (see chart for details).     1.  Bilateral lower extremity edema: Concern for  cellulitis of the lower extremity.  DVT is a consideration Venous ultrasound ordered-to be done in the outpatient setting CBC, CMP, point-of-care glucose Keflex 500 mg 3 times daily for 7 days. Lasix 40 mg orally daily Will call patient with recommendations if labs are abnormal Lung exam and heart evaluation is reassuring. Return precautions given. Final Clinical Impressions(s) / UC Diagnoses   Final diagnoses:  Bilateral lower extremity edema   Discharge Instructions   None    ED Prescriptions     Medication Sig Dispense Auth. Provider   cephALEXin (KEFLEX) 500 MG capsule Take 1 capsule (500 mg total) by mouth 3 (three) times daily for 7 days. 20 capsule Jasalyn Frysinger, Britta Mccreedy, MD      PDMP not reviewed this encounter.   Merrilee Jansky, MD 02/14/23 [phone]

## 2023-02-14 NOTE — ED Triage Notes (Signed)
Pt c/o bilateral lower leg swelling with pain x2 days. Denies taking meds for pain.

## 2023-02-14 NOTE — ED Notes (Signed)
Have written on discharge papers and discussed time, place for doppler study

## 2023-02-14 NOTE — Discharge Instructions (Addendum)
Please decrease salt intake to less than 3 g a day Decrease oral fluid intake to less than 2 L a day Elevate lower extremities when you can Will call you with recommendations if labs are abnormal If you develop chest pain, chest pressure, shortness of breath orthopnea, please go to the emergency department to be evaluated further.

## 2023-02-15 ENCOUNTER — Ambulatory Visit (HOSPITAL_COMMUNITY): Payer: 59

## 2023-02-15 ENCOUNTER — Encounter (HOSPITAL_COMMUNITY): Payer: Self-pay

## 2023-02-25 ENCOUNTER — Ambulatory Visit (HOSPITAL_COMMUNITY): Admission: RE | Admit: 2023-02-25 | Payer: 59 | Source: Ambulatory Visit

## 2023-11-17 ENCOUNTER — Ambulatory Visit (HOSPITAL_COMMUNITY)
Admission: EM | Admit: 2023-11-17 | Discharge: 2023-11-17 | Disposition: A | Discharge status: Home / Self Care | Attending: Psychiatry | Admitting: Psychiatry

## 2023-11-17 DIAGNOSIS — F411 Generalized anxiety disorder: Secondary | ICD-10-CM | POA: Insufficient documentation

## 2023-11-17 DIAGNOSIS — F330 Major depressive disorder, recurrent, mild: Secondary | ICD-10-CM | POA: Insufficient documentation

## 2023-11-17 DIAGNOSIS — F909 Attention-deficit hyperactivity disorder, unspecified type: Secondary | ICD-10-CM | POA: Insufficient documentation

## 2023-11-17 MED ORDER — HYDROXYZINE HCL 25 MG PO TABS: 25.0000 mg | ORAL_TABLET | Freq: Three times a day (TID) | ORAL | 0 refills | Status: AC | PRN

## 2023-11-17 NOTE — Discharge Instructions (Addendum)
 Follow up with [facility] Residents Only  Walk-in hours for open access (medication management and therapy) are Monday - Friday 8 am to 11 am. Appointments are limited, so please arrive at 6:45 am. Upon arrival, please complete the form on the clipboard located at the front desk. If there are no clipboards available, all appointments have been filled for that day.  [facility] [address] [place]  [address] [phone]   Get help right away if: You have thoughts about hurting yourself or others. Get help right away if you feel like you may hurt yourself or others, or have thoughts about taking your own life. Go to your nearest emergency room or: Call 911. Call the National Suicide Prevention Lifeline at 970-467-0331 or 988 in the [place] This is open 24 hours a day. If you're a Veteran: Call 988 and press 1. This is open 24 hours a day. Text the [employer] at (878) 780-2273. Summary Mental health is not just the absence of mental illness. It involves understanding your emotions and behaviors, and taking steps to manage them in a healthy way. If you have symptoms of mental or emotional distress, get help from family, friends, a health care provider, or a mental health professional. Practice good mental health behaviors such as stress management skills, self-calming skills, exercise, healthy sleeping and eating, and supportive relationships. This information is not intended to replace advice given to you by your health care provider. Make sure you discuss any questions you have with your health care provider.  Education provided on the fact that if experiencing worsening of psychiatry symptoms including suicidal ideations, homicidal ideations, or having auditory/visual hallucinations, etc, to call 911, 988, come back to this location, or go to the nearest ER. Pt verbalized understanding.
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
## 2023-11-17 NOTE — ED Notes (Signed)
 Patient discharged by provider.
